# Patient Record
Sex: Female | Born: 1945 | Race: White | Hispanic: No | Smoking: Former smoker
Health system: Southern US, Community
[De-identification: ages and names within clinical notes are randomized; demographics above are authoritative.]

## PROBLEM LIST (undated history)

## (undated) DIAGNOSIS — K219 Gastro-esophageal reflux disease without esophagitis: Secondary | ICD-10-CM

## (undated) DIAGNOSIS — Z923 Personal history of irradiation: Secondary | ICD-10-CM

## (undated) DIAGNOSIS — E039 Hypothyroidism, unspecified: Secondary | ICD-10-CM

## (undated) DIAGNOSIS — E538 Deficiency of other specified B group vitamins: Secondary | ICD-10-CM

## (undated) DIAGNOSIS — R079 Chest pain, unspecified: Secondary | ICD-10-CM

## (undated) DIAGNOSIS — E785 Hyperlipidemia, unspecified: Secondary | ICD-10-CM

## (undated) DIAGNOSIS — K5900 Constipation, unspecified: Secondary | ICD-10-CM

## (undated) DIAGNOSIS — F32A Depression, unspecified: Secondary | ICD-10-CM

## (undated) DIAGNOSIS — T7840XA Allergy, unspecified, initial encounter: Secondary | ICD-10-CM

## (undated) DIAGNOSIS — C3490 Malignant neoplasm of unspecified part of unspecified bronchus or lung: Secondary | ICD-10-CM

## (undated) DIAGNOSIS — Z87828 Personal history of other (healed) physical injury and trauma: Secondary | ICD-10-CM

## (undated) DIAGNOSIS — F329 Major depressive disorder, single episode, unspecified: Secondary | ICD-10-CM

## (undated) HISTORY — DX: Personal history of other (healed) physical injury and trauma: Z87.828

## (undated) HISTORY — DX: Gastro-esophageal reflux disease without esophagitis: K21.9

## (undated) HISTORY — DX: Hyperlipidemia, unspecified: E78.5

## (undated) HISTORY — DX: Constipation, unspecified: K59.00

## (undated) HISTORY — DX: Chest pain, unspecified: R07.9

## (undated) HISTORY — DX: Personal history of irradiation: Z92.3

## (undated) HISTORY — DX: Major depressive disorder, single episode, unspecified: F32.9

## (undated) HISTORY — DX: Malignant neoplasm of unspecified part of unspecified bronchus or lung: C34.90

## (undated) HISTORY — DX: Depression, unspecified: F32.A

## (undated) HISTORY — DX: Deficiency of other specified B group vitamins: E53.8

## (undated) HISTORY — DX: Hypothyroidism, unspecified: E03.9

---

## 2010-03-04 HISTORY — PX: REPLACEMENT TOTAL KNEE: SUR1224

## 2011-08-07 ENCOUNTER — Other Ambulatory Visit (HOSPITAL_COMMUNITY): Payer: Self-pay | Admitting: Family Medicine

## 2011-08-07 DIAGNOSIS — C3490 Malignant neoplasm of unspecified part of unspecified bronchus or lung: Secondary | ICD-10-CM

## 2011-08-12 ENCOUNTER — Encounter (HOSPITAL_COMMUNITY)
Admission: RE | Admit: 2011-08-12 | Discharge: 2011-08-12 | Disposition: A | Discharge status: Home / Self Care | Payer: Medicare Other | Source: Ambulatory Visit | Attending: Family Medicine | Admitting: Family Medicine

## 2011-08-12 DIAGNOSIS — I2510 Atherosclerotic heart disease of native coronary artery without angina pectoris: Secondary | ICD-10-CM | POA: Insufficient documentation

## 2011-08-12 DIAGNOSIS — N289 Disorder of kidney and ureter, unspecified: Secondary | ICD-10-CM | POA: Insufficient documentation

## 2011-08-12 DIAGNOSIS — I700 Atherosclerosis of aorta: Secondary | ICD-10-CM | POA: Insufficient documentation

## 2011-08-12 DIAGNOSIS — C3490 Malignant neoplasm of unspecified part of unspecified bronchus or lung: Secondary | ICD-10-CM | POA: Insufficient documentation

## 2011-08-12 DIAGNOSIS — R599 Enlarged lymph nodes, unspecified: Secondary | ICD-10-CM | POA: Insufficient documentation

## 2011-08-12 DIAGNOSIS — K5730 Diverticulosis of large intestine without perforation or abscess without bleeding: Secondary | ICD-10-CM | POA: Insufficient documentation

## 2011-08-12 DIAGNOSIS — N9489 Other specified conditions associated with female genital organs and menstrual cycle: Secondary | ICD-10-CM | POA: Insufficient documentation

## 2011-08-12 DIAGNOSIS — N200 Calculus of kidney: Secondary | ICD-10-CM | POA: Insufficient documentation

## 2011-08-12 LAB — GLUCOSE, CAPILLARY: Glucose-Capillary: 128 mg/dL — ABNORMAL HIGH (ref 70–99)

## 2011-08-12 MED ORDER — FLUDEOXYGLUCOSE F - 18 (FDG) INJECTION
18.2000 | Freq: Once | INTRAVENOUS | Status: AC | PRN
  Administered 2011-08-12: 18.2 via INTRAVENOUS

## 2011-12-03 DIAGNOSIS — C3490 Malignant neoplasm of unspecified part of unspecified bronchus or lung: Secondary | ICD-10-CM

## 2011-12-03 HISTORY — DX: Malignant neoplasm of unspecified part of unspecified bronchus or lung: C34.90

## 2013-03-04 HISTORY — PX: EYE SURGERY: SHX253

## 2013-03-04 HISTORY — PX: LITHOTRIPSY: SUR834

## 2013-08-18 ENCOUNTER — Encounter: Payer: Self-pay | Admitting: *Deleted

## 2013-08-18 ENCOUNTER — Institutional Professional Consult (permissible substitution) (INDEPENDENT_AMBULATORY_CARE_PROVIDER_SITE_OTHER): Payer: Medicare Other | Admitting: Surgery

## 2013-08-18 ENCOUNTER — Other Ambulatory Visit: Payer: Self-pay

## 2013-08-18 VITALS — BP 138/90 | HR 111 | Resp 18 | Ht 62.0 in | Wt 248.0 lb

## 2013-08-18 DIAGNOSIS — C3490 Malignant neoplasm of unspecified part of unspecified bronchus or lung: Secondary | ICD-10-CM

## 2013-08-18 DIAGNOSIS — D381 Neoplasm of uncertain behavior of trachea, bronchus and lung: Secondary | ICD-10-CM

## 2013-08-22 ENCOUNTER — Encounter: Payer: Self-pay | Admitting: Surgery

## 2013-08-22 NOTE — Progress Notes (Signed)
Cardiothoracic Surgery Consultation   PCP is No primary provider on file. Referring Provider is Lavera Guise, MD  Chief Complaint  Patient presents with  . Lung Cancer    surgical eval for possible right lower lobectomy, CT BX 08/05/13, PET Scan 08/03/13-[facility]    HPI:  The patient is a 68 year old woman with a history of morbid obesity, smoking and COPD, as well as Stage 3B lung adenocarcinoma for which she received definitive chemoradiation completed in 12/2011. A recent CT scan of the chest in [place] showed a new 3.2 x 3.3 cm mass in the right lower lobe suspicious for a new lung cancer. This was not seen on CT of the chest in 11/2012. She had a PET scan that showed this mass to be hypermetabolic with an SUV of 9.1. 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 Of significance, she recently tore a meniscus in her left knee and needs surgery for that. She was seen by Dr. Gerarda Gunther of cardiology and had a rest/stress Lexiscan that showed no ischemia with an EF of 46%.   Past Medical History  Diagnosis Date  . Lung cancer     stage IIIB   . Vitamin B12 deficiency   . History of torn meniscus of left knee   . Depressed   . Hyperlipemia   . Constipation   . Hypothyroidism   . Chest pain   . GERD (gastroesophageal reflux disease)     Past Surgical History  Procedure Laterality Date  . Replacement total knee Right 2012  . Eye surgery  2015    cataracts/both eyes  . Lithotripsy  2015    Family History  Problem Relation Age of Onset  . Adopted: Yes    Social History History  Substance Use Topics  . Smoking status: Former Smoker -- 0.50 packs/day for 46 years    Types: Cigarettes    Quit date: 05/18/2013  . Smokeless tobacco: Never Used  . Alcohol Use: No   Still smokes marijuana   Current Outpatient Prescriptions  Medication Sig Dispense Refill  . albuterol (PROVENTIL  HFA;VENTOLIN HFA) 108 (90 BASE) MCG/ACT inhaler Inhale 2 puffs into the lungs every 6 (six) hours as needed for wheezing or shortness of breath.      Marland Kitchen aspirin EC 81 MG tablet Take 81 mg by mouth daily.      . celecoxib (CELEBREX) 200 MG capsule 200 mg 2 (two) times daily.       . clonazePAM (KLONOPIN) 0.5 MG tablet 0.5 mg at bedtime.       . Cyanocobalamin (VITAMIN B-12 IJ) Inject 1,000 mcg as directed every 30 (thirty) days.      Marland Kitchen esomeprazole (NEXIUM) 40 MG capsule Take 40 mg by mouth daily at 12 noon.      . fenofibrate micronized (LOFIBRA) 134 MG capsule 134 mg.       . Fluticasone-Salmeterol (ADVAIR) 100-50 MCG/DOSE AEPB Inhale 1 puff into the lungs 2 (two) times daily.      . formoterol (PERFOROMIST) 20 MCG/2ML nebulizer solution Take 20 mcg by nebulization 2 (two) times daily as needed.      . furosemide (LASIX) 40 MG tablet 40 mg 2 (two) times daily.       Marland Kitchen levothyroxine (SYNTHROID, LEVOTHROID) 25 MCG tablet Take 25 mcg by mouth daily before breakfast.       . LYRICA 200 MG capsule Take 200 mg by mouth  3 (three) times daily.       . magnesium oxide (MAG-OX) 400 MG tablet Take 400 mg by mouth daily.      . metolazone (ZAROXOLYN) 5 MG tablet Take 5 mg by mouth daily.       Marland Kitchen nystatin (MYCOSTATIN) powder Apply topically 3 (three) times daily.      . Oxycodone HCl 10 MG TABS Take 10 mg by mouth every 6 (six) hours as needed.       . polyethylene glycol (MIRALAX / GLYCOLAX) packet Take 17 g by mouth daily.      . potassium chloride (K-DUR,KLOR-CON) 10 MEQ tablet Take 10 mEq by mouth daily.       . risperiDONE (RISPERDAL) 2 MG tablet Take 2 mg by mouth at bedtime.       . sertraline (ZOLOFT) 100 MG tablet Take 100 mg by mouth daily.       . traMADol (ULTRAM) 50 MG tablet Take 50 mg by mouth every 6 (six) hours as needed.      Marland Kitchen ZETIA 10 MG tablet Take 10 mg by mouth daily.        No current facility-administered medications for this visit.    Allergies  Allergen Reactions  . Taxol  [Paclitaxel] Anaphylaxis    Hypotension, cardiac arrest  . Iodine   . Ivp Dye [Iodinated Diagnostic Agents] Itching  . Adhesive [Tape] Rash    Review of Systems  Constitutional: Positive for activity change, fatigue and unexpected weight change. Negative for appetite change.       Weight gain  HENT:       Wears dentures  Eyes:       Floaters.  Says she needs eyelid lift surgery  Respiratory: Positive for cough, shortness of breath and wheezing.        Sleep apnea and uses home oxygen  Cardiovascular: Positive for leg swelling. Negative for chest pain.  Gastrointestinal:       Reflux  Endocrine: Negative.   Genitourinary: Positive for frequency.       Kidney stones  Musculoskeletal: Positive for arthralgias.  Allergic/Immunologic: Negative.   Neurological:       Neuropathy Chronic pain Memory problems  Hematological: Negative.   Psychiatric/Behavioral: The patient is nervous/anxious.        Depression    BP 138/90  Pulse 111  Resp 18  Ht 5\' 2"  (1.575 m)  Wt 248 lb (112.492 kg)  BMI 45.35 kg/m2  SpO2 96% Physical Exam  Constitutional: She is oriented to person, place, and time.  Morbidly obese woman in no distress. Walks slowly with walker.  HENT:  Head: Normocephalic and atraumatic.  Eyes: EOM are normal. Pupils are equal, round, and reactive to light.  Cardiovascular: Normal rate, regular rhythm and normal heart sounds.   No murmur heard. Pulmonary/Chest: Effort normal.  Breath sounds decreased throughout  Abdominal: Soft. Bowel sounds are normal. She exhibits no distension. There is no tenderness.  obese  Musculoskeletal: She exhibits edema.  Left knee in splint  Lymphadenopathy:    She has no cervical adenopathy.  Neurological: She is alert and oriented to person, place, and time. She has normal strength. No cranial nerve deficit or sensory deficit.  Skin: Skin is warm and dry.  Psychiatric: She has a normal mood and affect.     Diagnostic  Tests:  CT and PET scans from [facility] reviewed  Impression:  She has a 3 cm cancer in the right lower lobe of the lung  that would require a lobectomy to remove. She has not had PFT's but I am skeptical that she will be a candidate for a lobectomy given her obesity and smoking history. She would have a very difficult time with a lobectomy due to her obesity, COPD, and currently limited mobility due to her meniscal tear. She would probably be better off having her knee fixed before having any surgery on her chest done. We will schedule PFT's to better define her operability and I will see her back afterward to discuss the results with her and her family.  Plan:  Schedule PFT's ASAP and see her back in the office to discuss the results and decide if surgical resection is an option.

## 2013-08-23 ENCOUNTER — Telehealth: Payer: Self-pay | Admitting: *Deleted

## 2013-08-23 NOTE — Telephone Encounter (Signed)
Lindsay Ayala called to say that she has decided not to have surgery to treat her right lower lobe lung cancer.  She wished to have radiation.  I told her that I would inform Dr. Cyndia Bent of her decision and cancel this week's appointment with him.  I did encourage her to complete the PFT testing that has been scheduled as this will be helpful for the oncologists in her treatment.  She understood and agreed. I will forward a result of her PFT's to Dr. Jacquiline Doe when complete.

## 2013-08-24 ENCOUNTER — Encounter: Payer: Self-pay | Admitting: Radiation Oncology

## 2013-08-24 ENCOUNTER — Encounter: Payer: Self-pay | Admitting: *Deleted

## 2013-08-24 NOTE — Progress Notes (Signed)
Patient ID: Lindsay Ayala, female   DOB: 1946/02/28, 68 y.o.   MRN: [id number] This is a correction to the MD that was noted in my note of 08/23/13. The MD should have been Dr. Lubertha South, not Dr. Jacquiline Doe.

## 2013-08-24 NOTE — Progress Notes (Addendum)
Thoracic Location of Tumor / Histology: Right lower lobe lung   Patient presented { months ago with symptoms of: sob, pain  Biopsies of  (if applicable) revealed: 03/10/99: Right Lower Lung = 3 cm mass Poorly differentiated carcinoma with sarcopmatoid differentiation  Done in [facility]   Tobacco/Marijuana/Snuff/ETOH use: Marijuana   Use Occasionally , smoking cigarettes, 0.5 ppd 46 years,, quit 05/18/13,; never used smokeless tobacco, no alcohol,   Past/Anticipated interventions by cardiothoracic surgery, if any: patient has decided no surgery on her lung, wants radiation only  Past/Anticipated interventions by medical oncology, if any: started on Systemic  Chemotherapy using Carboplatin,paclitaxel for 2 cycles from 09/13/11 - 10/04/11,;  Last systemic chemotherapy   12/25/11 Signs/Symptoms   Weight changes, if any: stable   Respiratory complaints, if any: COPD  Hemoptysis, if any: No  Pain issues, if any: new left meniscus tear left knee, using walker,  pain 8/10 left knee  SAFETY ISSUES:  Prior radiation? Yes, B/L supraclavicular nodal and mediastinum   10/28/11-12/25/2011, 5760 cGy/76fx  in [place]   Pacemaker/ICD? No  Possible current pregnancy?No  Is the patient on methotrexate? No  Current Complaints / other details: Widowed,     Has  1 daughter,  HX Morbid Obesity,hx Stage 3B lung adenocarcinoma , , depression,bipolar Surgery Scheduled for her left meniscus tear Monday 08/30/13 at 1000 am, [place],with Dr. Frederick Peers ;no family history of cancer patient adopted

## 2013-08-25 ENCOUNTER — Ambulatory Visit
Admission: RE | Admit: 2013-08-25 | Discharge: 2013-08-25 | Disposition: A | Discharge status: Home / Self Care | Payer: Medicare Other | Source: Ambulatory Visit | Attending: Radiation Oncology | Admitting: Radiation Oncology

## 2013-08-25 ENCOUNTER — Encounter: Payer: Self-pay | Admitting: Radiation Oncology

## 2013-08-25 VITALS — BP 110/48 | HR 87 | Temp 97.7°F | Resp 22 | Ht 62.0 in | Wt 252.2 lb

## 2013-08-25 DIAGNOSIS — Z923 Personal history of irradiation: Secondary | ICD-10-CM | POA: Insufficient documentation

## 2013-08-25 DIAGNOSIS — Z96659 Presence of unspecified artificial knee joint: Secondary | ICD-10-CM | POA: Insufficient documentation

## 2013-08-25 DIAGNOSIS — Z79899 Other long term (current) drug therapy: Secondary | ICD-10-CM | POA: Diagnosis not present

## 2013-08-25 DIAGNOSIS — Z7982 Long term (current) use of aspirin: Secondary | ICD-10-CM | POA: Diagnosis not present

## 2013-08-25 DIAGNOSIS — Z87891 Personal history of nicotine dependence: Secondary | ICD-10-CM | POA: Insufficient documentation

## 2013-08-25 DIAGNOSIS — Z510 Encounter for antineoplastic radiation therapy: Secondary | ICD-10-CM | POA: Insufficient documentation

## 2013-08-25 DIAGNOSIS — Z9221 Personal history of antineoplastic chemotherapy: Secondary | ICD-10-CM | POA: Insufficient documentation

## 2013-08-25 DIAGNOSIS — C3430 Malignant neoplasm of lower lobe, unspecified bronchus or lung: Secondary | ICD-10-CM | POA: Diagnosis not present

## 2013-08-25 DIAGNOSIS — C3431 Malignant neoplasm of lower lobe, right bronchus or lung: Secondary | ICD-10-CM

## 2013-08-25 HISTORY — DX: Allergy, unspecified, initial encounter: T78.40XA

## 2013-08-26 ENCOUNTER — Encounter (HOSPITAL_COMMUNITY): Payer: Self-pay

## 2013-08-27 ENCOUNTER — Encounter: Payer: Self-pay | Admitting: *Deleted

## 2013-08-27 ENCOUNTER — Ambulatory Visit (HOSPITAL_COMMUNITY)
Admission: RE | Admit: 2013-08-27 | Discharge: 2013-08-27 | Disposition: A | Discharge status: Home / Self Care | Payer: Medicare Other | Source: Ambulatory Visit | Attending: Surgery | Admitting: Surgery

## 2013-08-27 ENCOUNTER — Ambulatory Visit: Payer: Medicare Other | Admitting: Surgery

## 2013-08-27 DIAGNOSIS — R062 Wheezing: Secondary | ICD-10-CM | POA: Insufficient documentation

## 2013-08-27 DIAGNOSIS — R05 Cough: Secondary | ICD-10-CM | POA: Diagnosis not present

## 2013-08-27 DIAGNOSIS — R059 Cough, unspecified: Secondary | ICD-10-CM | POA: Diagnosis not present

## 2013-08-27 DIAGNOSIS — D381 Neoplasm of uncertain behavior of trachea, bronchus and lung: Secondary | ICD-10-CM

## 2013-08-27 DIAGNOSIS — C3430 Malignant neoplasm of lower lobe, unspecified bronchus or lung: Secondary | ICD-10-CM | POA: Insufficient documentation

## 2013-08-27 DIAGNOSIS — C3490 Malignant neoplasm of unspecified part of unspecified bronchus or lung: Secondary | ICD-10-CM | POA: Insufficient documentation

## 2013-08-27 MED ORDER — ALBUTEROL SULFATE (2.5 MG/3ML) 0.083% IN NEBU
2.5000 mg | INHALATION_SOLUTION | Freq: Once | RESPIRATORY_TRACT | Status: AC
  Administered 2013-08-27: 2.5 mg via RESPIRATORY_TRACT

## 2013-08-27 NOTE — Progress Notes (Signed)
[place] Psychosocial Distress Screening Clinical Social Work  Clinical Social Work was referred by distress screening protocol.  The patient scored a 9 on the Psychosocial Distress Thermometer which indicates severe distress. Clinical Social Worker phoned pt to assess for distress and other psychosocial needs. Pt's number appears incorrect or a family member's. Pt appears to be currently prescribed medications to assist with anxiety and depression. CSW will continue to try to reach patient and will follow up at future appointments to assess and address needs fully.   ONCBCN DISTRESS SCREENING 08/25/2013  Screening Type Initial Screening  [place] the number that describes how much distress you have been experiencing in the past week 9  Emotional problem type Depression;Nervousness/Anxiety;Adjusting to illness;Feeling hopeless;Adjusting to appearance changes  Information Concerns Type Lack of info about diagnosis;Lack of info about treatment  Physical Problem type Pain;Swollen arms/legs;Getting around;Bathing/dressing;Breathing  Physician notified of physical symptoms Yes  Referral to clinical social work Yes    Clinical Social Worker follow up needed: Yes.    If yes, follow up plan: CSW will continue to try to reach patient and will follow up at future appointments to assess and address needs fully.   Loren Racer, LCSW Clinical Social Worker [facility] [place] Wednesday, Thursday and Friday Phone: [phone] Fax: [phone]

## 2013-08-27 NOTE — Progress Notes (Signed)
Radiation Oncology         [phone] ________________________________  Name: Jaasia Viglione MRN: [id number]  Date: 08/25/2013  DOB: [birth year]  ZT:IWPYK,DXIPJAS, MD  Marice Potter, MD     REFERRING PHYSICIAN: Marice Potter, MD   DIAGNOSIS: The primary encounter diagnosis was Malignant neoplasm of lower lobe of right lung. A diagnosis of Malignant neoplasm of lower lobe, bronchus, or lung was also pertinent to this visit.   HISTORY OF PRESENT ILLNESS::Lindsay Ayala is a 68 y.o. female who is seen for an initial consultation visit. The patient is seen today regarding her diagnosis of lung cancer. The patient has a history of stage IIIB non-small cell lung cancer. She underwent chemoradiation treatment in 2013 for this and this was completed in [place].  The patient appears to have had an excellent response and she has remained clinically NED since that time.  The patient has undergone followup CT imaging and a recent CT scan of the chest revealed a 3.3 cm mass within the right lower lobe which was suspicious for him Cancer. This appears to be in a discrete area from her prior treatment which was more superior. This site was not seen on prior CT imaging in September of 2014. The patient proceeded to undergo a PET scan which showed that this mass was hypermetabolic with a maximum SUV of 9.1. She did undergo a CT guided biopsy and this revealed a poorly differentiated carcinoma. This demonstrated squamous and sarcomatoid differentiation which was distinct from her prior history of adenocarcinoma. This is therefore felt to represent a new primary.  The patient has seen Dr. Caffie Pinto and cardiothoracic surgery. He has discussed possible resection with her. She does have some comorbidities and is quite non-mobile at this time to 2 a torn meniscus in the left knee. She is scheduled for upcoming surgery for this. She also has some lung disease and he has expressed some concerns about possible  surgery but did not rule this out. He has scheduled her to undergo pulmonary function testing later this week.  The patient at this time states that she is doing fairly well. She denies significant problems with shortness of breath at this time. No chest pain or other related complaints.   PREVIOUS RADIATION THERAPY: Yes as above definitive radiation treatment with concurrent chemotherapy and 2013. This was performed in [place].   PAST MEDICAL HISTORY:  has a past medical history of Vitamin B12 deficiency; History of torn meniscus of left knee; Depressed; Hyperlipemia; Constipation; Hypothyroidism; Chest pain; GERD (gastroesophageal reflux disease); Lung cancer (08/05/13); Allergy; and Lung cancer (12/2011).     PAST SURGICAL HISTORY: Past Surgical History  Procedure Laterality Date  . Replacement total knee Right 2012  . Eye surgery  2015    cataracts/both eyes  . Lithotripsy  2015     FAMILY HISTORY: family history is not on file. She was adopted.   SOCIAL HISTORY:  reports that she quit smoking about 3 months ago. Her smoking use included Cigarettes. She has a 23 pack-year smoking history. She has never used smokeless tobacco. She reports that she does not drink alcohol.   ALLERGIES: Taxol; Iodine; Ivp dye; Spiriva handihaler; and Adhesive   MEDICATIONS:  Current Outpatient Prescriptions  Medication Sig Dispense Refill  . albuterol (PROVENTIL HFA;VENTOLIN HFA) 108 (90 BASE) MCG/ACT inhaler Inhale 2 puffs into the lungs every 6 (six) hours as needed for wheezing or shortness of breath.      Marland Kitchen aspirin EC 81 MG tablet Take  81 mg by mouth daily.      . celecoxib (CELEBREX) 200 MG capsule 200 mg 2 (two) times daily.       . Choline Fenofibrate (FENOFIBRIC ACID) 135 MG CPDR TAKE 1 CAPSULE BY MOUTH DAILY.      . clonazePAM (KLONOPIN) 0.5 MG tablet 0.5 mg at bedtime.       Marland Kitchen esomeprazole (NEXIUM) 40 MG capsule TAKE 1 CAPSULE BY MOUTH 30 MINUTES BEFORE BREAKFAST      .  Fluticasone-Salmeterol (ADVAIR) 100-50 MCG/DOSE AEPB Inhale 1 puff into the lungs 2 (two) times daily.      . formoterol (PERFOROMIST) 20 MCG/2ML nebulizer solution Take 20 mcg by nebulization 2 (two) times daily as needed.      . furosemide (LASIX) 40 MG tablet 40 mg 2 (two) times daily.       Marland Kitchen ketoconazole (NIZORAL) 2 % cream Apply topically.      Marland Kitchen levothyroxine (SYNTHROID, LEVOTHROID) 25 MCG tablet Take 25 mcg by mouth daily before breakfast.       . LYRICA 200 MG capsule Take 200 mg by mouth 3 (three) times daily.       . magnesium oxide (MAG-OX) 400 MG tablet Take 400 mg by mouth daily.      . magnesium oxide (MAG-OX) 400 MG tablet Take 400 mg by mouth daily.       . metolazone (ZAROXOLYN) 5 MG tablet Take 5 mg by mouth daily.       . Misc. Devices MISC TENS unit with supplies      . nystatin (MYCOSTATIN) powder Apply topically 3 (three) times daily as needed.       . Oxycodone HCl 10 MG TABS Take 10 mg by mouth every 6 (six) hours as needed.       . polyethylene glycol (MIRALAX / GLYCOLAX) packet Take 17 g by mouth daily.      . potassium chloride (K-DUR,KLOR-CON) 10 MEQ tablet Take 10 mEq by mouth daily.       . risperiDONE (RISPERDAL) 2 MG tablet Take 2 mg by mouth at bedtime.       . sertraline (ZOLOFT) 100 MG tablet Take 100 mg by mouth at bedtime.       . sertraline (ZOLOFT) 100 MG tablet Take 50 mg by mouth every morning.       Marland Kitchen ZETIA 10 MG tablet Take 10 mg by mouth daily.       . Cyanocobalamin (VITAMIN B-12 IJ) Inject 1,000 mcg as directed every 30 (thirty) days.      . traMADol (ULTRAM) 50 MG tablet Take 50 mg by mouth every 6 (six) hours as needed.       No current facility-administered medications for this encounter.     REVIEW OF SYSTEMS:  A 15 point review of systems is documented in the electronic medical record. This was obtained by the nursing staff. However, I reviewed this with the patient to discuss relevant findings and make appropriate changes.  Pertinent items  are noted in HPI.    PHYSICAL EXAM:  height is 5\' 2"  (1.575 m) and weight is 252 lb 3.8 oz (114.415 kg). Her oral temperature is 97.7 F (36.5 C). Her blood pressure is 110/48 and her pulse is 87. Her respiration is 22 and oxygen saturation is 96%.   ECOG = 1  0 - Asymptomatic (Fully active, able to carry on all predisease activities without restriction)  1 - Symptomatic but completely ambulatory (Restricted in physically strenuous activity  but ambulatory and able to carry out work of a light or sedentary nature. For example, light housework, office work)  2 - Symptomatic, <50% in bed during the day (Ambulatory and capable of all self care but unable to carry out any work activities. Up and about more than 50% of waking hours)  3 - Symptomatic, >50% in bed, but not bedbound (Capable of only limited self-care, confined to bed or chair 50% or more of waking hours)  4 - Bedbound (Completely disabled. Cannot carry on any self-care. Totally confined to bed or chair)  5 - Death   Eustace Pen MM, Creech RH, Tormey DC, et al. [phone]). "Toxicity and response criteria of the [facility]". [place] Oncol. 5 (6): 649-55  General: Well-developed, in no acute distress HEENT: Normocephalic, atraumatic; oral cavity clear Neck: Supple without any lymphadenopathy Cardiovascular: Regular rate and rhythm Respiratory: Clear to auscultation bilaterally GI: Soft, nontender, normal bowel sounds Extremities: No edema present Neuro: No focal deficits     LABORATORY DATA:  No results found for this basename: WBC, HGB, HCT, MCV, PLT   No results found for this basename: NA, K, CL, CO2   No results found for this basename: ALT, AST, GGT, ALKPHOS, BILITOT      RADIOGRAPHY: No results found.     IMPRESSION: The patient has what appears to represent a new lung cancer with a history noted of stage IIIB non-small cell lung cancer status post chemoradiation treatment. Her prior lung  cancer consisted of adenocarcinoma and she now has a poorly differentiated carcinoma with different histologic features. She has discussed possible surgery with Dr. Caffie Pinto and he has ordered pulmonary function testing. She indicates to me today that she has basically decided that she does not want to proceed with surgery, really no matter what her pulmonary function testing shows. However if this shows unexpectedly good values and I believe she would be willing to reevaluate this. Otherwise, she is very interested in alternatives such as stereotactic body radiation treatment I have seen her today for consideration of this treatment modality.  Clinically, this represents a T2aN0 M0 non-small cell lung cancer based on the available information. I have requested additional information as her workup thus far has been completed at outside facility. Particularly, I would like to see the patient's recent PET scan.  I discussed with the patient a possible course of stereotactic body radiation treatment. This would likely involve 3-5 fractions and I discussed with her the rationale of such a treatment. We discussed the possible results and expected benefit of such a treatment as well as the possible side effects and risks as well. She is aware that you need to review some additional information prior to giving final recommendations, especially with respect to the specifics of her proposed treatment.  As noted above, the patient is going to undergo pulmonary function testing. After this is completed, she will decide whether she wants to revisit the issue of surgery and she will contact Dr. Caffie Pinto stop is regarding this. If she continues to want to proceed with an alternative to surgery, then I would recommend stereotactic body radiation treatment and we will have the patient return to clinic to further discuss and coordinate this.   PLAN: I will tentatively schedule the patient for a simulation in several weeks. The  patient is undergoing surgery next week and we will allow some recovery time after this.      ________________________________   Jodelle Gross, MD, PhD   **  Disclaimer: This note was dictated with voice recognition software. Similar sounding words can inadvertently be transcribed and this note Topaz Raglin contain transcription errors which Biannca Scantlin not have been corrected upon publication of note.**

## 2013-09-05 LAB — PULMONARY FUNCTION TEST
DL/VA % pred: 88 %
DL/VA: 4.03 ml/min/mmHg/L
DLCO COR % PRED: 77 %
DLCO COR: 16.68 ml/min/mmHg
DLCO unc % pred: 77 %
DLCO unc: 16.68 ml/min/mmHg
FEF 25-75 PRE: 1.33 L/s
FEF 25-75 Post: 1.79 L/sec
FEF2575-%CHANGE-POST: 34 %
FEF2575-%Pred-Post: 89 %
FEF2575-%Pred-Pre: 66 %
FEV1-%Change-Post: 8 %
FEV1-%PRED-PRE: 77 %
FEV1-%Pred-Post: 84 %
FEV1-PRE: 1.72 L
FEV1-Post: 1.86 L
FEV1FVC-%Change-Post: 3 %
FEV1FVC-%PRED-PRE: 96 %
FEV6-%Change-Post: 4 %
FEV6-%PRED-POST: 87 %
FEV6-%Pred-Pre: 83 %
FEV6-Post: 2.42 L
FEV6-Pre: 2.32 L
FEV6FVC-%Change-Post: 0 %
FEV6FVC-%Pred-Post: 103 %
FEV6FVC-%Pred-Pre: 104 %
FVC-%Change-Post: 4 %
FVC-%PRED-POST: 83 %
FVC-%Pred-Pre: 79 %
FVC-Post: 2.43 L
FVC-Pre: 2.32 L
POST FEV1/FVC RATIO: 77 %
POST FEV6/FVC RATIO: 100 %
Pre FEV1/FVC ratio: 74 %
Pre FEV6/FVC Ratio: 100 %
RV % pred: 97 %
RV: 1.95 L
TLC % PRED: 90 %
TLC: 4.3 L

## 2013-09-09 ENCOUNTER — Encounter: Payer: Self-pay | Admitting: Oncology

## 2013-09-09 ENCOUNTER — Telehealth: Payer: Self-pay

## 2013-09-09 NOTE — Telephone Encounter (Signed)
erroneous

## 2013-09-24 ENCOUNTER — Ambulatory Visit
Admission: RE | Admit: 2013-09-24 | Discharge: 2013-09-24 | Disposition: A | Discharge status: Home / Self Care | Payer: Medicare Other | Source: Ambulatory Visit | Attending: Radiation Oncology | Admitting: Radiation Oncology

## 2013-09-24 DIAGNOSIS — Z510 Encounter for antineoplastic radiation therapy: Secondary | ICD-10-CM | POA: Diagnosis not present

## 2013-09-24 DIAGNOSIS — C3430 Malignant neoplasm of lower lobe, unspecified bronchus or lung: Secondary | ICD-10-CM

## 2013-09-27 ENCOUNTER — Telehealth: Payer: Self-pay | Admitting: *Deleted

## 2013-09-27 NOTE — Telephone Encounter (Addendum)
error 

## 2013-09-27 NOTE — Telephone Encounter (Signed)
Patient called and said she wasn't sure if she would return for sbrt on 10/06/13, her right knee is hurting bad after Fridays' [place] will call her Orthopaedic Surgeon  To try and get to see him, she will call me on 10/05/13 to see if she will return for 1st rad sbrt treatment 8:32 AM

## 2013-09-27 NOTE — Telephone Encounter (Signed)
Lindsay Ayala,RT therapist came in this am and stated patient said she wanted Dr.moody's nurse to call her very important, I called this am back from vacation was not here Friday, left voice message for patient to return my call her number she left on Friday [phone], 8:06 AM

## 2013-10-01 DIAGNOSIS — Z510 Encounter for antineoplastic radiation therapy: Secondary | ICD-10-CM | POA: Diagnosis not present

## 2013-10-04 DIAGNOSIS — Z510 Encounter for antineoplastic radiation therapy: Secondary | ICD-10-CM | POA: Diagnosis not present

## 2013-10-05 ENCOUNTER — Telehealth: Payer: Self-pay | Admitting: *Deleted

## 2013-10-05 NOTE — Telephone Encounter (Signed)
Patient called ct simulation room ,couldn't transfer so I went to Ct simulation room and spoke with the patient,asked if she was going to come to her appt tomorrow at 145,"I just want to know why I have to get undressed to my underwear?", informed patient yes,but she would be covered from the waist down and that she had to fit into the bag that was molded to her body for treatment, she still feels uncomfortable about her last experience, assured her it would be better handled this time, and that the supervisor had taken care of that experience she had,patient will be here tomorrow at 130pm to register 9:01 AM

## 2013-10-06 ENCOUNTER — Ambulatory Visit
Admission: RE | Admit: 2013-10-06 | Discharge: 2013-10-06 | Disposition: A | Discharge status: Home / Self Care | Payer: Medicare Other | Source: Ambulatory Visit | Attending: Radiation Oncology | Admitting: Radiation Oncology

## 2013-10-06 DIAGNOSIS — C3430 Malignant neoplasm of lower lobe, unspecified bronchus or lung: Secondary | ICD-10-CM

## 2013-10-06 DIAGNOSIS — Z510 Encounter for antineoplastic radiation therapy: Secondary | ICD-10-CM | POA: Diagnosis not present

## 2013-10-06 NOTE — Progress Notes (Signed)
   Radiation Oncology         (336) [phone] ________________________________  Name: Lindsay Ayala MRN: [id number]  Date: 10/06/2013  DOB: 1945/04/10  Stereotactic Body Radiotherapy Treatment Procedure Note   NARRATIVE: Cricket Goodlin was brought to the stereotactic radiation treatment machine and placed supine on the CT couch. The patient was set up for stereotactic body radiotherapy on the body fix pillow.   3D TREATMENT PLANNING AND DOSIMETRY: The patient's radiation plan was reviewed and approved prior to starting treatment. It showed 3-dimensional radiation distributions overlaid onto the planning CT. The [facility] for the target structures as well as the organs at risk were reviewed. The documentation of this is filed in the radiation oncology EMR.   SIMULATION VERIFICATION: The patient underwent CT imaging on the treatment unit. These were carefully aligned to document that the ablative radiation dose would cover the target volume and maximally spare the nearby organs at risk according to the planned distribution.   SPECIAL TREATMENT PROCEDURE: [name] received high dose ablative stereotactic body radiotherapy to the planned target volume without unforeseen complications. Treatment was delivered uneventfully. The high doses associated with stereotactic body radiotherapy and the significant potential risks require careful treatment set up and patient monitoring constituting a special treatment procedure.   STEREOTACTIC TREATMENT MANAGEMENT: Following delivery, the patient was evaluated clinically. The patient tolerated treatment without significant acute effects, and was discharged to home in stable condition.   Fraction: 1  Dose:  10 Gy  PLAN: Continue treatment as planned.   ________________________________  Jodelle Gross, MD, PhD

## 2013-10-08 ENCOUNTER — Ambulatory Visit
Admission: RE | Admit: 2013-10-08 | Discharge: 2013-10-08 | Disposition: A | Discharge status: Home / Self Care | Payer: Medicare Other | Source: Ambulatory Visit | Attending: Radiation Oncology | Admitting: Radiation Oncology

## 2013-10-08 ENCOUNTER — Encounter: Payer: Self-pay | Admitting: Radiation Oncology

## 2013-10-08 VITALS — BP 106/49 | HR 92 | Temp 97.5°F | Resp 20 | Wt 249.3 lb

## 2013-10-08 DIAGNOSIS — Z510 Encounter for antineoplastic radiation therapy: Secondary | ICD-10-CM | POA: Diagnosis not present

## 2013-10-08 DIAGNOSIS — C3430 Malignant neoplasm of lower lobe, unspecified bronchus or lung: Secondary | ICD-10-CM

## 2013-10-08 NOTE — Progress Notes (Signed)
Department of Radiation Oncology  Phone:  [phone] Fax:        [phone]  Weekly Treatment Note    Name: Lindsay Ayala Date: 10/08/2013 MRN: [id number] DOB: Aug 02, 1945   Current dose: 20 Gy  Current fraction: 2   MEDICATIONS: Current Outpatient Prescriptions  Medication Sig Dispense Refill  . albuterol (PROVENTIL HFA;VENTOLIN HFA) 108 (90 BASE) MCG/ACT inhaler Inhale 2 puffs into the lungs every 6 (six) hours as needed for wheezing or shortness of breath.      Marland Kitchen aspirin EC 81 MG tablet Take 81 mg by mouth daily.      . celecoxib (CELEBREX) 200 MG capsule 200 mg 2 (two) times daily.       . Choline Fenofibrate (FENOFIBRIC ACID) 135 MG CPDR TAKE 1 CAPSULE BY MOUTH DAILY.      . clonazePAM (KLONOPIN) 0.5 MG tablet 0.5 mg at bedtime.       . Cyanocobalamin (VITAMIN B-12 IJ) Inject 1,000 mcg as directed every 30 (thirty) days.      Marland Kitchen esomeprazole (NEXIUM) 40 MG capsule TAKE 1 CAPSULE BY MOUTH 30 MINUTES BEFORE BREAKFAST      . Fluticasone-Salmeterol (ADVAIR) 100-50 MCG/DOSE AEPB Inhale 1 puff into the lungs 2 (two) times daily.      . formoterol (PERFOROMIST) 20 MCG/2ML nebulizer solution Take 20 mcg by nebulization 2 (two) times daily as needed.      . furosemide (LASIX) 40 MG tablet 40 mg 2 (two) times daily.       Marland Kitchen ketoconazole (NIZORAL) 2 % cream Apply topically.      Marland Kitchen levothyroxine (SYNTHROID, LEVOTHROID) 25 MCG tablet Take 25 mcg by mouth daily before breakfast.       . LYRICA 200 MG capsule Take 200 mg by mouth 3 (three) times daily.       . magnesium oxide (MAG-OX) 400 MG tablet Take 400 mg by mouth daily.      . magnesium oxide (MAG-OX) 400 MG tablet Take 400 mg by mouth daily.       . metolazone (ZAROXOLYN) 5 MG tablet Take 5 mg by mouth daily.       . Misc. Devices MISC TENS unit with supplies      . nystatin (MYCOSTATIN) powder Apply topically 3 (three) times daily as needed.       . Oxycodone HCl 10 MG TABS Take 10 mg by mouth every 6 (six) hours as needed.        . polyethylene glycol (MIRALAX / GLYCOLAX) packet Take 17 g by mouth daily.      . potassium chloride (K-DUR,KLOR-CON) 10 MEQ tablet Take 10 mEq by mouth daily.       . risperiDONE (RISPERDAL) 2 MG tablet Take 2 mg by mouth at bedtime.       . sertraline (ZOLOFT) 100 MG tablet Take 100 mg by mouth at bedtime.       . sertraline (ZOLOFT) 100 MG tablet Take 50 mg by mouth every morning.       . traMADol (ULTRAM) 50 MG tablet Take 50 mg by mouth every 6 (six) hours as needed.      Marland Kitchen ZETIA 10 MG tablet Take 10 mg by mouth daily.        No current facility-administered medications for this encounter.     ALLERGIES: Taxol; Iodine; Ivp dye; Spiriva handihaler; and Adhesive   LABORATORY DATA:  No results found for this basename: WBC, HGB, HCT, MCV, PLT   No results found for  this basename: NA, K, CL, CO2   No results found for this basename: ALT, AST, GGT, ALKPHOS, BILITOT     NARRATIVE: Lindsay Ayala was seen today for weekly treatment management. The chart was checked and the patient's films were reviewed. The patient states she has done well with treatment. Some occasional cough but no change in shortness of breath. No skin irritation.  PHYSICAL EXAMINATION: weight is 249 lb 4.8 oz (113.082 kg). Her temperature is 97.5 F (36.4 C). Her blood pressure is 106/49 and her pulse is 92. Her respiration is 20 and oxygen saturation is 95%.        ASSESSMENT: The patient is doing satisfactorily with treatment.  PLAN: We will continue with the patient's radiation treatment as planned.

## 2013-10-08 NOTE — Addendum Note (Signed)
Encounter addended by: Marye Round, MD on: 10/08/2013  5:25 PM<BR>     Documentation filed: Visit Diagnoses, Notes Section

## 2013-10-08 NOTE — Progress Notes (Signed)
   Radiation Oncology         [phone] ________________________________  Name: Yianna Tersigni MRN: [id number]  Date: 10/08/2013  DOB: 06-02-1945  Stereotactic Body Radiotherapy Treatment Procedure Note   NARRATIVE: Sherae Santino was brought to the stereotactic radiation treatment machine and placed supine on the CT couch. The patient was set up for stereotactic body radiotherapy on the body fix pillow.   3D TREATMENT PLANNING AND DOSIMETRY: The patient's radiation plan was reviewed and approved prior to starting treatment. It showed 3-dimensional radiation distributions overlaid onto the planning CT. The [facility] for the target structures as well as the organs at risk were reviewed. The documentation of this is filed in the radiation oncology EMR.   SIMULATION VERIFICATION: The patient underwent CT imaging on the treatment unit. These were carefully aligned to document that the ablative radiation dose would cover the target volume and maximally spare the nearby organs at risk according to the planned distribution.   SPECIAL TREATMENT PROCEDURE: [name] received high dose ablative stereotactic body radiotherapy to the planned target volume without unforeseen complications. Treatment was delivered uneventfully. The high doses associated with stereotactic body radiotherapy and the significant potential risks require careful treatment set up and patient monitoring constituting a special treatment procedure.   STEREOTACTIC TREATMENT MANAGEMENT: Following delivery, the patient was evaluated clinically. The patient tolerated treatment without significant acute effects, and was discharged to home in stable condition.   Fraction: 2  Dose:  20 Gy  PLAN: Continue treatment as planned.   ________________________________  Jodelle Gross, MD, PhD

## 2013-10-08 NOTE — Progress Notes (Signed)
Patient here for weekly assessment of radiation to right lower lung.Completed 2 of 5.Routine of clinic reviewed.Denies pain or cough.No change in shortness of breath.Side effects to be minimal with possible skin discoloration and fatigue.

## 2013-10-11 ENCOUNTER — Ambulatory Visit
Admission: RE | Admit: 2013-10-11 | Discharge: 2013-10-11 | Disposition: A | Discharge status: Home / Self Care | Payer: Medicare Other | Source: Ambulatory Visit | Attending: Radiation Oncology | Admitting: Radiation Oncology

## 2013-10-11 ENCOUNTER — Encounter: Payer: Self-pay | Admitting: Radiation Oncology

## 2013-10-11 DIAGNOSIS — Z510 Encounter for antineoplastic radiation therapy: Secondary | ICD-10-CM | POA: Diagnosis not present

## 2013-10-11 NOTE — Progress Notes (Signed)
  Radiation Oncology         [phone] ________________________________  Name: Lindsay Ayala MRN: [id number]  Date: 10/11/2013  DOB: Dec 12, 1945  Stereotactic Body Radiotherapy Treatment Procedure Note  NARRATIVE:  Lindsay Ayala was brought to the stereotactic radiation treatment machine and placed supine on the CT couch. The patient was set up for stereotactic body radiotherapy on the body fix pillow.  3D TREATMENT PLANNING AND DOSIMETRY:  The patient's radiation plan was reviewed and approved prior to starting treatment.  It showed 3-dimensional radiation distributions overlaid onto the planning CT.  The [facility] for the target structures as well as the organs at risk were reviewed. The documentation of this is filed in the radiation oncology EMR.  SIMULATION VERIFICATION:  The patient underwent CT imaging on the treatment unit.  These were carefully aligned to document that the ablative radiation dose would cover the target volume and maximally spare the nearby organs at risk according to the planned distribution.  SPECIAL TREATMENT PROCEDURE: Lindsay Ayala received high dose ablative stereotactic body radiotherapy to the planned target volume without unforeseen complications. Treatment was delivered uneventfully. The high doses associated with stereotactic body radiotherapy and the significant potential risks require careful treatment set up and patient monitoring constituting a special treatment procedure   STEREOTACTIC TREATMENT MANAGEMENT:  Following delivery, the patient was evaluated clinically. The patient tolerated treatment without significant acute effects, and was discharged to home in stable condition.    PLAN: Continue treatment as planned.  ________________________________  Eppie Gibson, MD

## 2013-10-13 ENCOUNTER — Ambulatory Visit: Payer: Medicare Other | Admitting: Radiation Oncology

## 2013-10-13 ENCOUNTER — Ambulatory Visit
Admission: RE | Admit: 2013-10-13 | Discharge: 2013-10-13 | Disposition: A | Discharge status: Home / Self Care | Payer: Medicare Other | Source: Ambulatory Visit | Attending: Radiation Oncology | Admitting: Radiation Oncology

## 2013-10-13 DIAGNOSIS — C3430 Malignant neoplasm of lower lobe, unspecified bronchus or lung: Secondary | ICD-10-CM

## 2013-10-13 DIAGNOSIS — Z510 Encounter for antineoplastic radiation therapy: Secondary | ICD-10-CM | POA: Diagnosis not present

## 2013-10-14 ENCOUNTER — Ambulatory Visit: Payer: Medicare Other | Admitting: Radiation Oncology

## 2013-10-15 ENCOUNTER — Ambulatory Visit
Admission: RE | Admit: 2013-10-15 | Discharge: 2013-10-15 | Disposition: A | Discharge status: Home / Self Care | Payer: Medicare Other | Source: Ambulatory Visit | Attending: Radiation Oncology | Admitting: Radiation Oncology

## 2013-10-15 ENCOUNTER — Encounter: Payer: Self-pay | Admitting: Radiation Oncology

## 2013-10-15 DIAGNOSIS — Z510 Encounter for antineoplastic radiation therapy: Secondary | ICD-10-CM | POA: Diagnosis not present

## 2013-10-15 DIAGNOSIS — C3430 Malignant neoplasm of lower lobe, unspecified bronchus or lung: Secondary | ICD-10-CM

## 2013-10-15 NOTE — Progress Notes (Signed)
[facility] Radiation On[facility] Simulation and Treatment Planning Note   Name:  Angala Hilgers MRN: [id number]   Date: 09/24/2013  DOB: 1945/04/07  Status:outpatient    DIAGNOSIS: The encounter diagnosis was Malignant neoplasm of lower lobe, bronchus, or lung.  SITE:  Right lower lobe   CONSENT VERIFIED:yes   SET UP: Patient is setup supine   IMMOBILIZATION: The patient was immobilized using a Vac Loc bag, and a customized active form device was also constructed to aid in patient immobilization. The patient set up also involved abdominal compression to attempt to reduce respiratory motion. A total of 2 complex treatment devices therefore will be used for immobilization during the course of radiation.    NARRATIVE:The patient was brought to the [facility].  Identity was confirmed.  All relevant records and images related to the planned course of therapy were reviewed.  Then, the patient was positioned in a stable reproducible clinical set-up for radiation therapy. Abdominal compression was applied by me.  4D CT images were obtained and reproducible breathing pattern was confirmed. Free breathing CT images were obtained.  Skin markings were placed.  The CT images were loaded into the planning software where the target and avoidance structures were contoured.  The radiation prescription was entered and confirmed.    TREATMENT PLANNING NOTE:  Treatment planning then occurred. I have requested : MLC's, 3D simulation/ isodose plan, basic dose calculation. It is anticipated that 2 customized fields will be used for the patient's treatment, with each of these corresponding to an additional complex treatment device.  3 dimensional simulation is performed and dose volume histogram of the gross tumor volume, planning tumor volume and criticial normal structures including the spinal cord and lungs were analyzed and requested.  Special treatment procedure was  performed due to high dose per fraction and the complexity of the planning process.  The patient will be monitored for increased risk of toxicity.  Daily imaging using cone beam CT/ MV CT will be used for target localization.   PLAN:  The patient will receive 50 Gy in 5 fractions.    ________________________________   Jodelle Gross, MD, PhD

## 2013-10-15 NOTE — Progress Notes (Signed)
Weekly rad SBRT RLL lung, 5/5 completed,  no c/o coughing, pain, , 1 month f/u appt card given, she will have her daughter call back and make that appt, patient has good appetite, in w/c, unsteady gait,  Energy level poor, does take rest periods,  Gets lightheaded at times getting out of bed, , has walker at home, low b/p, today 94/50, says "Its been nlow ever since I started taking radiation therapy 2:17 PM

## 2013-10-15 NOTE — Progress Notes (Signed)
   Radiation Oncology         [phone]) [phone] ________________________________  Name: Lindsay Ayala MRN: [id number]  Date: 10/15/2013  DOB: August 07, 1945  Stereotactic Body Radiotherapy Treatment Procedure Note   NARRATIVE: Cheyrl Buley was brought to the stereotactic radiation treatment machine and placed supine on the CT couch. The patient was set up for stereotactic body radiotherapy on the body fix pillow.   3D TREATMENT PLANNING AND DOSIMETRY: The patient's radiation plan was reviewed and approved prior to starting treatment. It showed 3-dimensional radiation distributions overlaid onto the planning CT. The [facility] for the target structures as well as the organs at risk were reviewed. The documentation of this is filed in the radiation oncology EMR.   SIMULATION VERIFICATION: The patient underwent CT imaging on the treatment unit. These were carefully aligned to document that the ablative radiation dose would cover the target volume and maximally spare the nearby organs at risk according to the planned distribution.   SPECIAL TREATMENT PROCEDURE: [name] received high dose ablative stereotactic body radiotherapy to the planned target volume without unforeseen complications. Treatment was delivered uneventfully. The high doses associated with stereotactic body radiotherapy and the significant potential risks require careful treatment set up and patient monitoring constituting a special treatment procedure.   STEREOTACTIC TREATMENT MANAGEMENT: Following delivery, the patient was evaluated clinically. The patient tolerated treatment without significant acute effects, and was discharged to home in stable condition.   Fraction: 5  Dose:  50 Gy  PLAN: Continue treatment as planned.   ________________________________  Jodelle Gross, MD, PhD

## 2013-10-24 NOTE — Progress Notes (Signed)
   Radiation Oncology         [phone] ________________________________  Name: Lindsay Ayala MRN: [id number]  Date: 10/13/2013  DOB: [birth year]  Stereotactic Body Radiotherapy Treatment Procedure Note   NARRATIVE: Lindsay Ayala was brought to the stereotactic radiation treatment machine and placed supine on the CT couch. The patient was set up for stereotactic body radiotherapy on the body fix pillow.   3D TREATMENT PLANNING AND DOSIMETRY: The patient's radiation plan was reviewed and approved prior to starting treatment. It showed 3-dimensional radiation distributions overlaid onto the planning CT. The [facility] for the target structures as well as the organs at risk were reviewed. The documentation of this is filed in the radiation oncology EMR.   SIMULATION VERIFICATION: The patient underwent CT imaging on the treatment unit. These were carefully aligned to document that the ablative radiation dose would cover the target volume and maximally spare the nearby organs at risk according to the planned distribution.   SPECIAL TREATMENT PROCEDURE: Lindsay Ayala received high dose ablative stereotactic body radiotherapy to the planned target volume without unforeseen complications. Treatment was delivered uneventfully. The high doses associated with stereotactic body radiotherapy and the significant potential risks require careful treatment set up and patient monitoring constituting a special treatment procedure.   STEREOTACTIC TREATMENT MANAGEMENT: Following delivery, the patient was evaluated clinically. The patient tolerated treatment without significant acute effects, and was discharged to home in stable condition.   Fraction: 4  Dose:  40 Gy  PLAN: Continue treatment as planned.   ________________________________  Jodelle Gross, MD, PhD

## 2013-10-24 NOTE — Progress Notes (Signed)
  Radiation Oncology         [phone] ________________________________  Name: Lindsay Ayala MRN: [id number]  Date: 10/15/2013  DOB: 1945/12/27  End of Treatment Note  Diagnosis:   Non-small cell lung cancer     Indication for treatment:  Curative       Radiation treatment dates:   10/06/2013 through 10/15/2013  Site/dose:   The patient was treated to the right lower lung to a dose of 50 gray in 5 fractions. This consisted of a stereotactic body radiation treatment using 2 customized fields.  Narrative: The patient tolerated radiation treatment relatively well.   The patient denied any worsening shortness of breath or pain during treatment.  Plan: The patient has completed radiation treatment. The patient will return to radiation oncology clinic for routine followup in one month. I advised the patient to call or return sooner if they have any questions or concerns related to their recovery or treatment. ________________________________  Jodelle Gross, M.D., Ph.D.

## 2013-11-10 ENCOUNTER — Telehealth: Payer: Self-pay | Admitting: *Deleted

## 2013-11-10 NOTE — Telephone Encounter (Signed)
Daughter Lindsay Ayala, daughter callee left vm saying "mother is having complications, asked for call back", called daughter and was infomred she clled her mothr and mom was disoriented and stated she's been having diarrhea every few minutes""patient did check her oxygen sats=90%, daughter is at [facility] with her daughter, asked if radiation done to he1 month ago could be causing this?", asked if patient had a fever, not known, doubt this is from radiation , we treated her chest,, patient might have a  Stomach virus  Or urinary infection , daughter will call patient's primary MD, and will go check on her mom once she gets through with her daughter at [facility], patient may need imodium ad OTC, daughter thanked me for the call back, patient has a f/u appt 11/24/13 here with Dr.Moody 1:52 PM

## 2013-11-16 ENCOUNTER — Telehealth: Payer: Self-pay | Admitting: *Deleted

## 2013-11-16 ENCOUNTER — Encounter: Payer: Self-pay | Admitting: *Deleted

## 2013-11-16 NOTE — Telephone Encounter (Signed)
Lindsay Ayala called left vm  On my phone, returned call, she stated she did take her mom to [facility] hospital and was d/c'd yesterday evening, she had UTI, low oxygen, low plateletss and hemoglobin , confusion, also she has a mass right lower lung  Now 4.5x 5.41 with necrosis, ,I know she compleetd back 10/15/13, and she has an appt with Dr.moody 11/24/13 can she get an earlier appt ?, infomred her I could transfer her to the scheduler ,but MD off today and he is booked solid Wednesday and Thursday and Friday, asked if patient in distress, "no, she eting, and wearing her oxygen and I have made sure she is taking her medicationscorrectly, she had stopped taking her  meds for 5 days and wasn't wearing her oxygen those days either', she stated she is anemic her hgb went to 6.5 from 8 but went bback up again, no transfusion stated daughter but wants an MD to addrees her anemia, informed her patient's medical oncologist or primary MD to do that, daughter stated, I guess we can just keep the [facility] [facility] appt, declined to have me transfer the call to our scheduler, Santiago Glad Dillingham 1:22 PM

## 2013-11-24 ENCOUNTER — Ambulatory Visit
Admission: RE | Admit: 2013-11-24 | Discharge: 2013-11-24 | Disposition: A | Discharge status: Home / Self Care | Payer: Medicare Other | Source: Ambulatory Visit | Attending: Radiation Oncology | Admitting: Radiation Oncology

## 2013-11-24 ENCOUNTER — Encounter: Payer: Self-pay | Admitting: Radiation Oncology

## 2013-11-24 ENCOUNTER — Telehealth: Payer: Self-pay | Admitting: *Deleted

## 2013-11-24 VITALS — BP 123/43 | HR 104 | Temp 97.5°F | Resp 20 | Ht 62.0 in | Wt 242.5 lb

## 2013-11-24 DIAGNOSIS — C3430 Malignant neoplasm of lower lobe, unspecified bronchus or lung: Secondary | ICD-10-CM

## 2013-11-24 NOTE — Addendum Note (Signed)
Encounter addended by: Rebecca Eaton, RN on: 11/24/2013  3:26 PM<BR>     Documentation filed: Arn Medal VN

## 2013-11-24 NOTE — Telephone Encounter (Signed)
CALLED PATIENT TO INFORM OF CT ON 03-02-14 @ [place] (RADIOLOGY) - ARRIVAL TIME - 1 PM AND HER FU VISIT WITH DR. MOODY ON 03-10-14 @ 3:30 PM SPOKE WITH PATIENT 'S DAUGHTER - STEPHANIE WHITE AND SHE IS AWARE OF THESE APPTS.

## 2013-11-24 NOTE — Progress Notes (Signed)
Radiation Oncology         [phone]) [phone] ________________________________  Name: Lindsay Ayala MRN: [id number]  Date: 11/24/2013  DOB: 15-May-1945  Follow-Up Visit Note  CC: Lindsay Bills, MD  Lindsay Bills, MD  Diagnosis:      Malignant neoplasm of lower lobe, bronchus, or lung   08/27/2013 Initial Diagnosis Malignant neoplasm of lower lobe, bronchus, or lung   10/06/2013 - 10/15/2013 Radiation Therapy Stereotactic body radiation treatment to right lower lobe tumor to 50 gray in 5 fractions.  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     Narrative:  The patient returns today for routine follow-up.  The patient was hospitalized for several days recently. She had a urinary tract infection. This led to some confusion. The patient is doing much better at this time.  She has some chronic pain in the left knee. She has a dry cough on occasion. No significant change in shortness of breath. The patient did have a scan of the head as well as a CT scan of the chest during her hospital stay from [place]. I had a chance to personally reviewed these scans.                              ALLERGIES:  is allergic to taxol; iodine; ivp dye; spiriva handihaler; and adhesive.  Meds: Current Outpatient Prescriptions  Medication Sig Dispense Refill  . albuterol (PROVENTIL HFA;VENTOLIN HFA) 108 (90 BASE) MCG/ACT inhaler Inhale 2 puffs into the lungs every 6 (six) hours as needed for wheezing or shortness of breath.      Marland Kitchen aspirin EC 81 MG tablet Take 81 mg by mouth daily.      . Choline Fenofibrate (FENOFIBRIC ACID) 135 MG CPDR TAKE 1 CAPSULE BY MOUTH DAILY.      . ciprofloxacin (CIPRO) 500 MG tablet Take 500 mg by mouth 2 (two) times daily. Will complete 11/26/13 Frdday      . clonazePAM (KLONOPIN) 0.5 MG tablet 0.5 mg at bedtime.       . Cyanocobalamin (VITAMIN B-12 IJ) Inject 1,000 mcg as directed every 30 (thirty) days.      Marland Kitchen esomeprazole (NEXIUM) 40 MG capsule  TAKE 1 CAPSULE BY MOUTH 30 MINUTES BEFORE BREAKFAST      . Ibuprofen-Famotidine (DUEXIS) 800-26.6 MG TABS Take 1 tablet by mouth 3 (three) times daily.      Marland Kitchen ketoconazole (NIZORAL) 2 % cream Apply topically daily as needed.       Marland Kitchen levothyroxine (SYNTHROID, LEVOTHROID) 25 MCG tablet Take 25 mcg by mouth daily before breakfast.       . LYRICA 200 MG capsule Take 200 mg by mouth 3 (three) times daily.       . magnesium oxide (MAG-OX) 400 MG tablet Take 400 mg by mouth daily.      . Misc. Devices MISC TENS unit with supplies      . nystatin (MYCOSTATIN) powder Apply topically 3 (three) times daily as needed.       . risperiDONE (RISPERDAL) 2 MG tablet Take 2 mg by mouth at bedtime.       . sertraline (ZOLOFT) 100 MG tablet Take 100 mg by mouth at bedtime.       . sertraline (ZOLOFT) 100 MG tablet Take 50 mg by mouth every morning.       Marland Kitchen ZETIA 10 MG tablet Take 10 mg by mouth daily.       Marland Kitchen  celecoxib (CELEBREX) 200 MG capsule 200 mg 2 (two) times daily.       . Fluticasone-Salmeterol (ADVAIR) 100-50 MCG/DOSE AEPB Inhale 1 puff into the lungs 2 (two) times daily as needed.       . formoterol (PERFOROMIST) 20 MCG/2ML nebulizer solution Take 20 mcg by nebulization 2 (two) times daily as needed.      . furosemide (LASIX) 40 MG tablet 40 mg 2 (two) times daily.       . furosemide (LASIX) 40 MG tablet Take 40 mg by mouth as needed.      . Oxycodone HCl 10 MG TABS Take 10 mg by mouth every 6 (six) hours as needed.       . polyethylene glycol (MIRALAX / GLYCOLAX) packet Take 17 g by mouth daily.      . potassium chloride (K-DUR,KLOR-CON) 10 MEQ tablet Take 10 mEq by mouth daily.       . traMADol (ULTRAM) 50 MG tablet Take 50 mg by mouth every 6 (six) hours as needed.       No current facility-administered medications for this encounter.    Physical Findings: The patient is in no acute distress. Patient is alert and oriented.  height is 5\' 2"  (1.575 m) and weight is 242 lb 8 oz (109.997 kg). Her  oral temperature is 97.5 F (36.4 C). Her blood pressure is 123/43 and her pulse is 104. Her respiration is 20 and oxygen saturation is 96%. .   Clear to auscultation bilaterally. Decreased breath sounds within the right lower lobe at the base.  Lab Findings: No results found for this basename: WBC, HGB, HCT, MCV, PLT     Radiographic Findings: No results found.  Impression:    The patient completed stereotactic body radiation treatment to the right lower lobe approximately one month ago. The patient's recent head CT scan was negative. The CT scan of the chest appeared to show some possible increase in the size of the tumor. Given the timeframe since stereotactic body radiation treatment, I would use this study as primarily a baseline to compare future studies. Radiation effect and some swelling in the region of the tumor and lead to difficulties with interpretation of progression. Some internal characteristics of the tumor had changed and I recommended continued followup with a repeat CT scan of the chest in several months. Further evaluation could be warranted at that time with suspicion of progression.  I discussed this in some detail with the patient and her family. She has an appointment with Dr. Bobby Ayala in medical oncology in a couple of months. I think that this would be fine but they expressed an interest in moving this to after my visit with her at the next appointment after a repeat scan. This also I believe would be appropriate.  Plan:  CT scan of the chest in 3 months, then followup appointment.  I spent 15 minutes with the patient today, the majority of which was spent counseling the patient on the diagnosis of cancer and coordinating care.   Lindsay Ayala, M.D., Ph.D.

## 2013-11-24 NOTE — Progress Notes (Signed)
Follow up s/p rad rll lung 10/06/13-10/15/13, in w/c, has UTI with E-coli was in hospital [place], several days , no dyusria, on cipro now bid completes this Friday, room air 99%, uses 2 liters oxygen n/c  At night, coughs dry, has phel;gm in throat can't get up stated,  Fatigued, pain in left knee where she has meniscus tear?,  Appetite good,  2:22 PM

## 2013-12-31 IMAGING — PT NM PET TUM IMG INITIAL (PI) SKULL BASE T - THIGH
6 series · 25 of 25 positions shown · non-contrast
Comparison: No priors.

CLINICAL DATA: Initial treatment strategy for lung cancer.

NUCLEAR MEDICINE PET SKULL BASE TO THIGH
Fasting Blood Glucose:  128
TECHNIQUE: 18.2 mCi F-18 FDG was injected intravenously. CT data
was obtained and used for attenuation correction and anatomic
localization only.  (This was not acquired as a diagnostic CT
examination.) Additional exam technical data entered on
technologist worksheet.

[Series 1: pet ac · axial · 3.3mm · 4.69mm/px · z∈[-895,-25]mm · 5 of 267 slices shown]
[im 1/267]
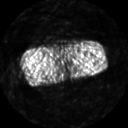
[im 67/267]
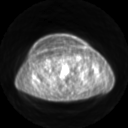
[im 134/267]
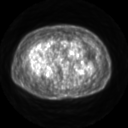
[im 200/267]
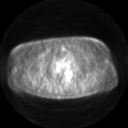
[im 267/267]
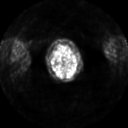

[Series 2: pet nac · axial · 3.3mm · 4.69mm/px · z∈[-895,-25]mm · 6 of 267 slices shown]
[im 1/267]
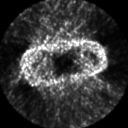
[im 54/267]
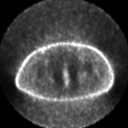
[im 107/267]
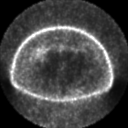
[im 160/267]
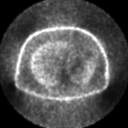
[im 213/267]
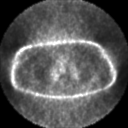
[im 267/267]
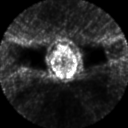

[Series 2: ct images · axial · 3.8mm · 0.98mm/px · z∈[-895,-26]mm · 5 of 265 slices shown]
[im 1/265]
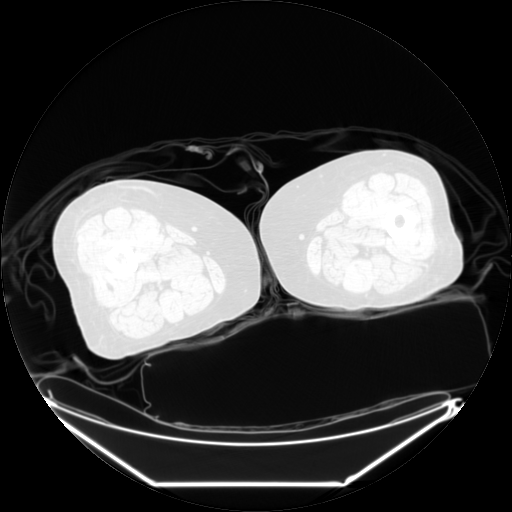
[im 67/265]
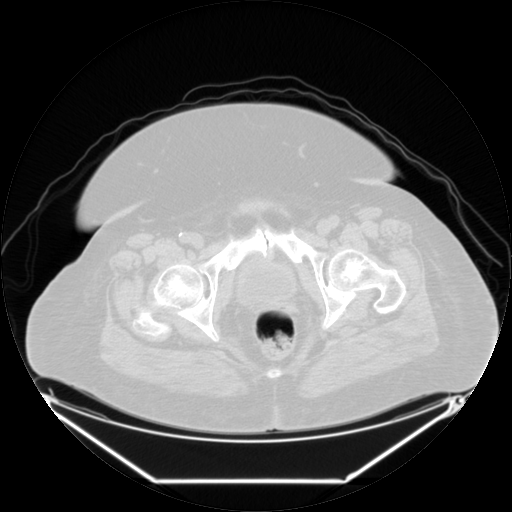
[im 133/265]
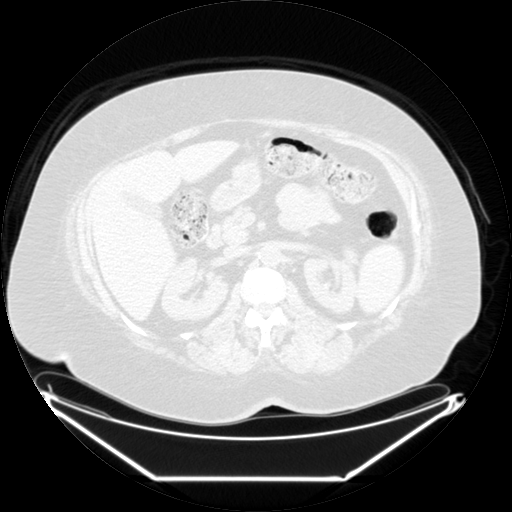
[im 199/265]
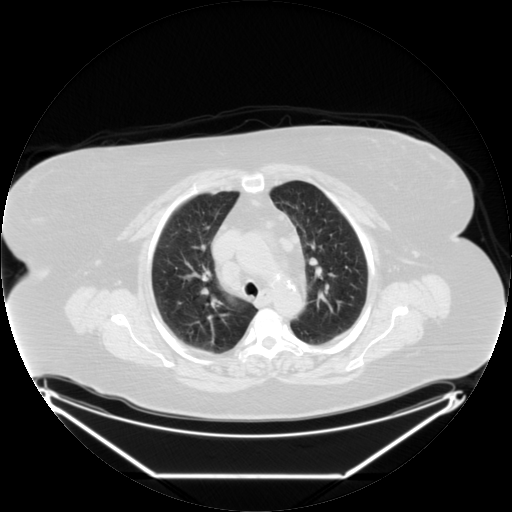
[im 265/265  brain]
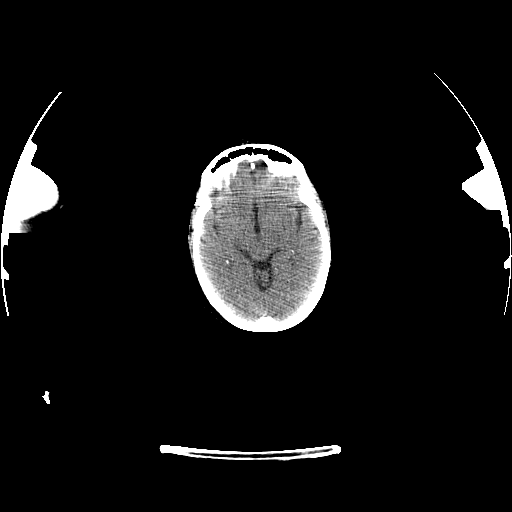

[Series 123: mip · coronal · 3.3mm · 4.69mm/px · 1 of 30 slices shown]
[im 1/30]
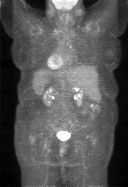

[Series 151: reformatted · axial · 3.3mm · 3.91mm/px · z∈[-895,-25]mm · 6 of 265 slices shown (1 of 2)]
[im 1/265]
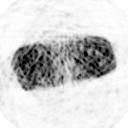
[im 53/265]
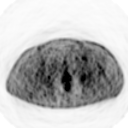
[im 106/265]
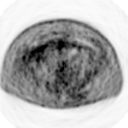
[im 159/265]
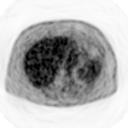
[im 212/265]
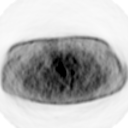
[im 265/265]
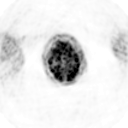

[Series 153: reformatted · coronal · 4.7mm · 6.98mm/px · 2 of 79 slices shown (2 of 2)]
[im 1/79]
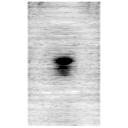
[im 79/79]
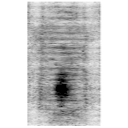

[25 of 25 positions shown; findings below may reference images not displayed]

FINDINGS: Neck: No hypermetabolic lymph nodes in the neck.

Chest: In the left supraclavicular region and there are multiple
enlarged lymph nodes, largest which measures up to 1.3 cm in short
axis, which demonstrate hypermetabolic activity (SUVmax = 4.4).
Multiple enlarged right paratracheal lymph nodes are noted
measuring up to 1.9 cm in short axis, with malignant range
hypermetabolic activity (SUVmax = 3.5 - 5.0). Multiple additional
borderline enlarged and mildly enlarged anterior mediastinal lymph
nodes are also noted, although these do not demonstrate a definite
malignant range metabolic activity.  There is atherosclerosis of
the thoracic aorta, the great vessels of the mediastinum and the
coronary arteries, including calcified atherosclerotic plaque in
the left main, left anterior descending, left circumflex and right
coronary arteries. Heart size is normal. There is no significant
pericardial fluid, thickening or pericardial calcification.  There
is a small hiatal hernia.  Within the lungs, there are no definite
suspicious appearing pulmonary nodules or masses, and no focal
hypermetabolic activity.  No pleural effusions.

Abdomen/Pelvis:  No abnormal hypermetabolic activity within the
liver, pancreas, adrenal glands, or spleen.  No hypermetabolic
lymph nodes in the abdomen or pelvis.  [DATE] x 3.0 cm low attenuation
lesion in the periphery of the interpolar region of the right
kidney demonstrates no internal metabolic activity, and most likely
represents a cyst. There is a similar lesion in the lower pole of
the left kidney measuring 2 cm in diameter, and a 2.2 cm low
attenuation lesion in the posterior aspect of the interpolar region
of the left kidney.  There are multiple small high attenuation foci
within the collecting systems of the kidneys bilaterally,
compatible with nonobstructive calculi.  The largest of these is in
the lower pole collecting system of the right kidney measuring 6
mm.  Diffusely decreased attenuation throughout the hepatic
parenchyma suggests mild hepatic steatosis. Numerous colonic
diverticula, without surrounding inflammatory changes to suggest
acute diverticulitis at this time.  Normal appendix.  In the right
adnexa there is a 3.9 x 3.1 cm low attenuation lesion that
demonstrates no increased metabolic activity on the PET portion of
the examination and most likely represents an ovarian cyst or
paraovarian cyst (technically incompletely characterized).
Atherosclerotic calcifications throughout the abdominal and pelvic
vasculature.

[place]:  No focal hypermetabolic activity to suggest skeletal
metastasis.
IMPRESSION: 1.  Enlarged and hypermetabolic mediastinal and left
supraclavicular adenopathy, as detailed above, concerning for
malignancy.  This appearance would be unusual for lung cancer,
although this could conceivably represent a small cell carcinoma.
This is more favored to represent a lymphoma.  Clinical correlation
is recommended.
2. No definite suspicious appearing hypermetabolic pulmonary nodule
or mass is identified on today's examination.
3.  Atherosclerosis, including left main and three-vessel coronary
artery disease. Please note that although the presence of coronary
artery calcium documents the presence of coronary artery disease,
the severity of this disease and any potential stenosis cannot be
assessed on this non-gated CT examination.  Assessment for
potential risk factor modification, dietary therapy or
pharmacologic therapy may be warranted, if clinically indicated.
4.  Multiple nonobstructive calculi in the collecting systems of
the kidneys bilaterally, largest of which is 6 mm in the left lower
pole collecting system.
5.  Multiple low attenuation lesions in the kidneys bilaterally,
which are not technically not characterize on today's examination,
but have imaging characteristics that are most suggestive of cysts.
6.  Probable right ovarian cyst or paraovarian cyst measuring 3.8 x
3.1 cm.  This could be further evaluated with a follow-up
transvaginal ultrasound in 6-10 weeks to confirm stability or
resolution in this postmenopausal female.

## 2014-03-10 ENCOUNTER — Ambulatory Visit: Payer: Medicare Other | Admitting: Radiation Oncology

## 2014-04-04 DEATH — deceased
